# Patient Record
Sex: Male | Born: 1990 | Race: White | Hispanic: No | Marital: Single | Smoking: Current every day smoker
Health system: Southern US, Community
[De-identification: ages and names within clinical notes are randomized; demographics above are authoritative.]

## PROBLEM LIST (undated history)

## (undated) HISTORY — PX: APPENDECTOMY: SHX54

---

## 2013-12-09 ENCOUNTER — Emergency Department: Payer: Self-pay

## 2013-12-09 LAB — CBC WITH DIFFERENTIAL/PLATELET
Basophil #: 0.1 10*3/uL (ref 0.0–0.1)
Basophil %: 1.1 %
EOS ABS: 0.1 10*3/uL (ref 0.0–0.7)
Eosinophil %: 1.8 %
HCT: 47.7 % (ref 40.0–52.0)
HGB: 16.5 g/dL (ref 13.0–18.0)
Lymphocyte #: 1.5 10*3/uL (ref 1.0–3.6)
Lymphocyte %: 23.7 %
MCH: 31.3 pg (ref 26.0–34.0)
MCHC: 34.7 g/dL (ref 32.0–36.0)
MCV: 90 fL (ref 80–100)
MONOS PCT: 9.3 %
Monocyte #: 0.6 x10 3/mm (ref 0.2–1.0)
Neutrophil #: 4 10*3/uL (ref 1.4–6.5)
Neutrophil %: 64.1 %
Platelet: 215 10*3/uL (ref 150–440)
RBC: 5.28 10*6/uL (ref 4.40–5.90)
RDW: 12.2 % (ref 11.5–14.5)
WBC: 6.2 10*3/uL (ref 3.8–10.6)

## 2013-12-09 LAB — URINALYSIS, COMPLETE
BILIRUBIN, UR: NEGATIVE
Bacteria: NONE SEEN
Blood: NEGATIVE
Glucose,UR: NEGATIVE mg/dL (ref 0–75)
Ketone: NEGATIVE
Leukocyte Esterase: NEGATIVE
Nitrite: NEGATIVE
Ph: 6 (ref 4.5–8.0)
Protein: NEGATIVE
RBC,UR: 1 /HPF (ref 0–5)
Specific Gravity: 1.02 (ref 1.003–1.030)
Squamous Epithelial: NONE SEEN
WBC UR: 1 /HPF (ref 0–5)

## 2013-12-09 LAB — COMPREHENSIVE METABOLIC PANEL
ALBUMIN: 4.6 g/dL (ref 3.4–5.0)
ALK PHOS: 84 U/L
ALT: 19 U/L (ref 12–78)
ANION GAP: 2 — AB (ref 7–16)
BUN: 12 mg/dL (ref 7–18)
Bilirubin,Total: 0.6 mg/dL (ref 0.2–1.0)
CHLORIDE: 105 mmol/L (ref 98–107)
Calcium, Total: 9.2 mg/dL (ref 8.5–10.1)
Co2: 31 mmol/L (ref 21–32)
Creatinine: 0.92 mg/dL (ref 0.60–1.30)
GLUCOSE: 88 mg/dL (ref 65–99)
OSMOLALITY: 275 (ref 275–301)
POTASSIUM: 3.9 mmol/L (ref 3.5–5.1)
SGOT(AST): 29 U/L (ref 15–37)
Sodium: 138 mmol/L (ref 136–145)
Total Protein: 7.8 g/dL (ref 6.4–8.2)

## 2013-12-09 LAB — LIPASE, BLOOD: Lipase: 90 U/L (ref 73–393)

## 2014-05-05 ENCOUNTER — Emergency Department: Payer: Self-pay | Admitting: Student

## 2014-05-08 ENCOUNTER — Emergency Department: Payer: Self-pay | Admitting: Emergency Medicine

## 2017-08-19 ENCOUNTER — Emergency Department: Payer: No Typology Code available for payment source

## 2017-08-19 ENCOUNTER — Emergency Department
Admission: EM | Admit: 2017-08-19 | Discharge: 2017-08-19 | Disposition: A | Discharge status: Home / Self Care | Payer: No Typology Code available for payment source | Attending: Emergency Medicine | Admitting: Emergency Medicine

## 2017-08-19 ENCOUNTER — Encounter: Payer: Self-pay | Admitting: Emergency Medicine

## 2017-08-19 DIAGNOSIS — W010XXA Fall on same level from slipping, tripping and stumbling without subsequent striking against object, initial encounter: Secondary | ICD-10-CM | POA: Diagnosis not present

## 2017-08-19 DIAGNOSIS — Y939 Activity, unspecified: Secondary | ICD-10-CM | POA: Insufficient documentation

## 2017-08-19 DIAGNOSIS — F17200 Nicotine dependence, unspecified, uncomplicated: Secondary | ICD-10-CM | POA: Diagnosis not present

## 2017-08-19 DIAGNOSIS — Y990 Civilian activity done for income or pay: Secondary | ICD-10-CM | POA: Insufficient documentation

## 2017-08-19 DIAGNOSIS — Y929 Unspecified place or not applicable: Secondary | ICD-10-CM | POA: Insufficient documentation

## 2017-08-19 DIAGNOSIS — S3992XA Unspecified injury of lower back, initial encounter: Secondary | ICD-10-CM | POA: Diagnosis present

## 2017-08-19 DIAGNOSIS — W19XXXA Unspecified fall, initial encounter: Secondary | ICD-10-CM

## 2017-08-19 DIAGNOSIS — S300XXA Contusion of lower back and pelvis, initial encounter: Secondary | ICD-10-CM | POA: Diagnosis not present

## 2017-08-19 NOTE — ED Provider Notes (Signed)
[facility] Emergency Department Provider Note ____________________________________________  Time seen: 1858  I have reviewed the triage vital signs and the nursing notes.  HISTORY  Chief Complaint  Fall   HPI Austin Armstrong is a 27 y.o. male presents himself to the ED for evaluation of injury sustained following a mechanical fall.  Patient describes being at work yesterday evening, when he accidentally slipped on a wet floor.  He describes falling after he lost his footing, landing on his back.  He denies any head injury, loss of consciousness, laceration, or abrasion.  He presents today at the urging of his employer for further evaluation and management.  He describes pain and tightness to the left lower back that is worse with prolonged sitting.  He denies any distal paresthesias, footdrop, or incontinence.  Also denies any previous history of chronic ongoing back pain.  Patient rates his pain at a 6 out of 10 during the exam.  History reviewed. No pertinent past medical history.  There are no active problems to display for this patient.   Past Surgical History:  Procedure Laterality Date  . APPENDECTOMY      Prior to Admission medications   Not on File    Allergies Zyrtec [cetirizine]  No family history on file.  Social History Social History   Tobacco Use  . Smoking status: Current Every Day Smoker  . Smokeless tobacco: Never Used  Substance Use Topics  . Alcohol use: Yes  . Drug use: No    Review of Systems  Constitutional: Negative for fever. Cardiovascular: Negative for chest pain. Respiratory: Negative for shortness of breath. Gastrointestinal: Negative for abdominal pain, vomiting and diarrhea. Genitourinary: Negative for dysuria. Musculoskeletal: Positive for back pain. Skin: Negative for rash. Neurological: Negative for headaches, focal weakness or numbness. ____________________________________________  PHYSICAL  EXAM:  VITAL SIGNS: ED Triage Vitals  Enc Vitals Group     BP 08/19/17 1804 (!) 147/85     Pulse Rate 08/19/17 1804 63     Resp 08/19/17 1804 20     Temp 08/19/17 1804 97.9 F (36.6 C)     Temp Source 08/19/17 1804 Oral     SpO2 08/19/17 1804 99 %     Weight 08/19/17 1804 205 lb (93 kg)     Height 08/19/17 1804 5\' 10"  (1.778 m)     Head Circumference --      Peak Flow --      Pain Score 08/19/17 1809 6     Pain Loc --      Pain Edu? --      Excl. in GC? --     Constitutional: Alert and oriented. Well appearing and in no distress. Head: Normocephalic and atraumatic. Cardiovascular: Normal rate, regular rhythm. Normal distal pulses. Respiratory: Normal respiratory effort. No wheezes/rales/rhonchi. Musculoskeletal: No spinal alignment without midline tenderness, spasm, deformity, or step-off.  No obvious bruising, contusion, or ecchymosis noted to the lower back.  Patient is mildly tender to palpation over the left lumbar sacral paraspinal musculature.  He is able to transition from sit to stand without assistance.  Normal lumbar flexion and extension range on exam.  Nontender with normal range of motion in all extremities.  Neurologic: Cranial nerves II through XII grossly intact.  Negative Trendelenburg.  Negative seated straight leg raise.  Normal LE DTRs bilaterally.  Normal foot eversion and toe dorsiflexion.  Normal gait without ataxia. Normal speech and language. No gross focal neurologic deficits are appreciated. Skin:  Skin is  warm, dry and intact. No rash noted. Psychiatric: Mood and affect are normal. Patient exhibits appropriate insight and judgment. ____________________________________________   RADIOLOGY  Lumbar Spine  Negative ____________________________________________  INITIAL IMPRESSION / ASSESSMENT AND PLAN / ED COURSE  Patient with ED evaluation of injury sustained following a chemical fall yesterday.  Patient describes a contusion to the lower back after he  slipped on wet floor.  His exam is overall benign at this time.  No indication of any acute fracture, dislocation, or neuromuscular deficit.  Patient is also reassured by his negative x-rays.  He reports that he intends to return to work tomorrow as scheduled.  He will follow with his company's medical provider for ongoing symptoms.  He will dose over-the-counter ibuprofen as needed. ____________________________________________  FINAL CLINICAL IMPRESSION(S) / ED DIAGNOSES  Final diagnoses:  Fall, initial encounter  Contusion of lower back, initial encounter      Lissa HoardMenshew, Anu Stagner V Bacon, PA-C 08/19/17 2047    Minna AntisPaduchowski, Kevin, MD 08/19/17 2340

## 2017-08-19 NOTE — Discharge Instructions (Signed)
Your exam and x-ray are negative at this time. You will likely experience some sore muscles over the next few days. Take OTC ibuprofen for pain and inflammation. Apply ice or moist heat to reduce any tightness. Follow-up with [facility] for continued symptoms.

## 2017-08-19 NOTE — ED Triage Notes (Signed)
Pt here for workers comp after falling at work. Pt had mechanical fall. Pt denies hitting head or losing consciousness. Pt complains of lower back pain. Pt is able to move all four extremities independently.

## 2018-08-09 ENCOUNTER — Encounter: Payer: Self-pay | Admitting: Emergency Medicine

## 2018-08-09 DIAGNOSIS — A0811 Acute gastroenteropathy due to Norwalk agent: Secondary | ICD-10-CM | POA: Insufficient documentation

## 2018-08-09 DIAGNOSIS — F17200 Nicotine dependence, unspecified, uncomplicated: Secondary | ICD-10-CM | POA: Insufficient documentation

## 2018-08-09 LAB — COMPREHENSIVE METABOLIC PANEL
ALT: 51 U/L — ABNORMAL HIGH (ref 0–44)
AST: 39 U/L (ref 15–41)
Albumin: 4.6 g/dL (ref 3.5–5.0)
Alkaline Phosphatase: 95 U/L (ref 38–126)
Anion gap: 7 (ref 5–15)
BUN: 15 mg/dL (ref 6–20)
CO2: 27 mmol/L (ref 22–32)
Calcium: 8.8 mg/dL — ABNORMAL LOW (ref 8.9–10.3)
Chloride: 105 mmol/L (ref 98–111)
Creatinine, Ser: 0.71 mg/dL (ref 0.61–1.24)
GFR calc non Af Amer: >60 mL/min (ref >60)
Glucose, Bld: 104 mg/dL — ABNORMAL HIGH (ref 70–99)
Potassium: 3.8 mmol/L (ref 3.5–5.1)
Sodium: 139 mmol/L (ref 135–145)
Total Bilirubin: 1 mg/dL (ref 0.3–1.2)
Total Protein: 7.3 g/dL (ref 6.5–8.1)

## 2018-08-09 LAB — URINALYSIS, COMPLETE (UACMP) WITH MICROSCOPIC
BILIRUBIN URINE: NEGATIVE
Bacteria, UA: NONE SEEN
Glucose, UA: NEGATIVE mg/dL
Hgb urine dipstick: NEGATIVE
Ketones, ur: NEGATIVE mg/dL
Leukocytes, UA: NEGATIVE
NITRITE: NEGATIVE
Protein, ur: NEGATIVE mg/dL
SPECIFIC GRAVITY, URINE: 1.026 (ref 1.005–1.030)
pH: 5 (ref 5.0–8.0)

## 2018-08-09 LAB — CBC
HCT: 48.1 % (ref 39.0–52.0)
Hemoglobin: 17 g/dL (ref 13.0–17.0)
MCH: 31.3 pg (ref 26.0–34.0)
MCHC: 35.3 g/dL (ref 30.0–36.0)
MCV: 88.4 fL (ref 80.0–100.0)
NRBC: 0 % (ref 0.0–0.2)
Platelets: 261 10*3/uL (ref 150–400)
RBC: 5.44 MIL/uL (ref 4.22–5.81)
RDW: 11.5 % (ref 11.5–15.5)
WBC: 12.7 10*3/uL — ABNORMAL HIGH (ref 4.0–10.5)

## 2018-08-09 LAB — TROPONIN I: Troponin I: <0.03 ng/mL (ref <0.03)

## 2018-08-09 LAB — LIPASE, BLOOD: LIPASE: 25 U/L (ref 11–51)

## 2018-08-09 MED ORDER — SODIUM CHLORIDE 0.9% FLUSH: 3.0000 mL | Freq: Once | INTRAVENOUS | Status: DC

## 2018-08-09 NOTE — ED Triage Notes (Signed)
Pt c/o upper epigastric abdominal pain since Sunday as well as N/V. Pt denies diarrhea.

## 2018-08-10 ENCOUNTER — Emergency Department
Admission: EM | Admit: 2018-08-10 | Discharge: 2018-08-10 | Disposition: A | Discharge status: Home / Self Care | Payer: Self-pay | Attending: Emergency Medicine | Admitting: Emergency Medicine

## 2018-08-10 DIAGNOSIS — A0811 Acute gastroenteropathy due to Norwalk agent: Secondary | ICD-10-CM

## 2018-08-10 LAB — GASTROINTESTINAL PANEL BY PCR, STOOL (REPLACES STOOL CULTURE)

## 2018-08-10 LAB — C DIFFICILE QUICK SCREEN W PCR REFLEX
C Diff antigen: NEGATIVE
C Diff interpretation: NOT DETECTED
C Diff toxin: NEGATIVE

## 2018-08-10 MED ORDER — ONDANSETRON HCL 4 MG/2ML IJ SOLN
4.0000 mg | Freq: Once | INTRAMUSCULAR | Status: AC
  Administered 2018-08-10: 4 mg via INTRAVENOUS
  Filled 2018-08-10: qty 2

## 2018-08-10 MED ORDER — LOPERAMIDE HCL 2 MG PO CAPS
4.0000 mg | ORAL_CAPSULE | Freq: Once | ORAL | Status: AC
  Administered 2018-08-10: 4 mg via ORAL
  Filled 2018-08-10: qty 2

## 2018-08-10 MED ORDER — SODIUM CHLORIDE 0.9 % IV BOLUS
1000.0000 mL | Freq: Once | INTRAVENOUS | Status: AC
  Administered 2018-08-10: 1000 mL via INTRAVENOUS

## 2018-08-10 MED ORDER — ONDANSETRON 4 MG PO TBDP: 4.0000 mg | ORAL_TABLET | Freq: Three times a day (TID) | ORAL | 0 refills | Status: AC | PRN

## 2018-08-10 NOTE — ED Provider Notes (Addendum)
[facility] Emergency Department Provider Note  ____________________________________________   First MD Initiated Contact with Patient 08/10/18 [phone]     (approximate)  I have reviewed the triage vital signs and the nursing notes.   HISTORY  Chief Complaint Abdominal Pain   HPI Austin Armstrong is a 28 y.o. male presents to the emergency department with abdominal cramping nausea vomiting and diarrhea which began on Sunday.  Patient denies any fever afebrile on presentation.  Patient states that he has had multiple episodes of nonbloody emesis and diarrhea.  Patient denies any known sick contact.   No pertinent past medical history   There are no active problems to display for this patient.   Past Surgical History:  Procedure Laterality Date  . APPENDECTOMY      Prior to Admission medications   Medication Sig Start Date End Date Taking? Authorizing Provider  ondansetron (ZOFRAN ODT) 4 MG disintegrating tablet Take 1 tablet (4 mg total) by mouth every 8 (eight) hours as needed for nausea or vomiting. 08/10/18   Darci CurrentBrown, Laurel N, MD    Allergies Zyrtec [cetirizine]  History reviewed. No pertinent family history.  Social History Social History   Tobacco Use  . Smoking status: Current Every Day Smoker  . Smokeless tobacco: Never Used  Substance Use Topics  . Alcohol use: Yes  . Drug use: No    Review of Systems Constitutional: No fever/chills Eyes: No visual changes. ENT: No sore throat. Cardiovascular: Denies chest pain. Respiratory: Denies shortness of breath. Gastrointestinal: Positive for abdominal cramping nausea vomiting diarrhea Genitourinary: Negative for dysuria. Musculoskeletal: Negative for neck pain.  Negative for back pain. Integumentary: Negative for rash. Neurological: Negative for headaches, focal weakness or numbness.  ____________________________________________   PHYSICAL EXAM:  VITAL SIGNS: ED Triage  Vitals [08/09/18 2234]  Enc Vitals Group     BP 138/84     Pulse Rate (!) 105     Resp 20     Temp 98 F (36.7 C)     Temp Source Oral     SpO2 96 %     Weight 95.3 kg (210 lb)     Height 1.753 m (5\' 9" )     Head Circumference      Peak Flow      Pain Score      Pain Loc      Pain Edu?      Excl. in GC?     Constitutional: Alert and oriented. Well appearing and in no acute distress. Eyes: Conjunctivae are normal. Mouth/Throat: Mucous membranes are dry.  Oropharynx non-erythematous. Neck: No stridor.   Cardiovascular: Normal rate, regular rhythm. Good peripheral circulation. Grossly normal heart sounds. Respiratory: Normal respiratory effort.  No retractions. Lungs CTAB. Gastrointestinal: Soft and nontender. No distention.  Musculoskeletal: No lower extremity tenderness nor edema. No gross deformities of extremities. Neurologic:  Normal speech and language. No gross focal neurologic deficits are appreciated.  Skin:  Skin is warm, dry and intact. No rash noted.   ____________________________________________   LABS (all labs ordered are listed, but only abnormal results are displayed)  Labs Reviewed  GASTROINTESTINAL PANEL BY PCR, STOOL (REPLACES STOOL CULTURE) - Abnormal; Notable for the following components:      Result Value   Norovirus GI/GII DETECTED (*)    All other components within normal limits  COMPREHENSIVE METABOLIC PANEL - Abnormal; Notable for the following components:   Glucose, Bld 104 (*)    Calcium 8.8 (*)    ALT  51 (*)    All other components within normal limits  CBC - Abnormal; Notable for the following components:   WBC 12.7 (*)    All other components within normal limits  URINALYSIS, COMPLETE (UACMP) WITH MICROSCOPIC - Abnormal; Notable for the following components:   Color, Urine YELLOW (*)    APPearance CLEAR (*)    All other components within normal limits  C DIFFICILE QUICK SCREEN W PCR REFLEX  LIPASE, BLOOD  TROPONIN I    ED ECG  REPORT I, [place] N Yerania Chamorro, the attending physician, personally viewed and interpreted this ECG.   Date: 08/09/2018  EKG Time: 10:43 PM  Rate: 103  Rhythm: Sinus tachycardia  Axis: Normal  Intervals: Normal  ST&T Change: None   Procedures   ____________________________________________   INITIAL IMPRESSION / ASSESSMENT AND PLAN / ED COURSE  As part of my medical decision making, I reviewed the following data within the electronic MEDICAL RECORD NUMBER  28 year old male presenting with above-stated history and physical exam concerning for possible infectious diarrhea.  Stool sample was obtained which is positive for norovirus.  Patient was given 2 L IV normal saline IV Zofran 4 mg and Imodium 4 mg no further vomiting or diarrhea while in the emergency department. ____________________________________________  FINAL CLINICAL IMPRESSION(S) / ED DIAGNOSES  Final diagnoses:  Enteritis due to Norovirus     MEDICATIONS GIVEN DURING THIS VISIT:  Medications  sodium chloride flush (NS) 0.9 % injection 3 mL (0 mLs Intravenous Hold 08/10/18 0101)  sodium chloride 0.9 % bolus 1,000 mL (0 mLs Intravenous Stopped 08/10/18 0353)  sodium chloride 0.9 % bolus 1,000 mL (0 mLs Intravenous Stopped 08/10/18 0353)  ondansetron (ZOFRAN) injection 4 mg (4 mg Intravenous Given 08/10/18 0136)  loperamide (IMODIUM) capsule 4 mg (4 mg Oral Given 08/10/18 0129)     ED Discharge Orders         Ordered    ondansetron (ZOFRAN ODT) 4 MG disintegrating tablet  Every 8 hours PRN     08/10/18 0340           Note:  This document was prepared using Dragon voice recognition software and may include unintentional dictation errors.    Darci Current, MD 08/10/18 8280    Darci Current, MD 09/02/18 [phone]

## 2018-08-10 NOTE — ED Notes (Signed)
Pt states he has had abdominal pain for past 2 weeks and it got worse on yesterday. Pain centrally located. He also has nausea, vomiting and diarrhea. Family at bedside.

## 2019-02-02 IMAGING — CR DG LUMBAR SPINE COMPLETE 4+V
1 series · 5 of 5 positions shown · non-contrast
Comparison: None.

CLINICAL DATA: Low back pain.  Fall.

EXAM:
LUMBAR SPINE - COMPLETE 4+ VIEW

[Series 1: dg lumbar spine complete 4 +v · 0.14mm/px · 5 of 5 slices shown]
[im 1/5]
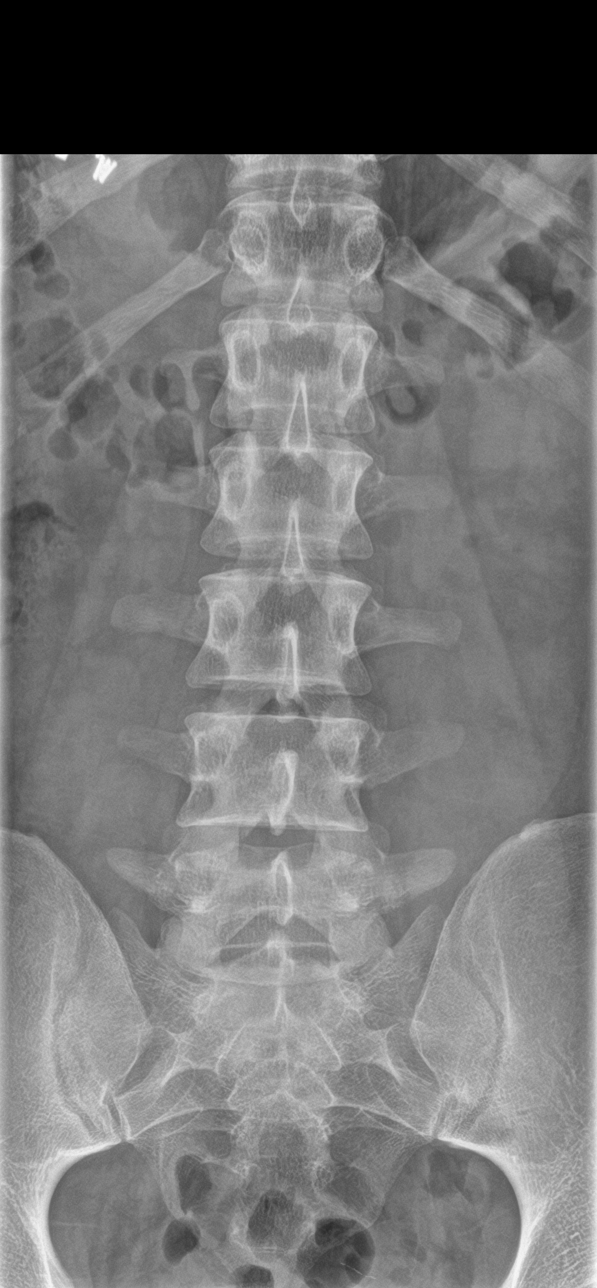
[im 2/5]
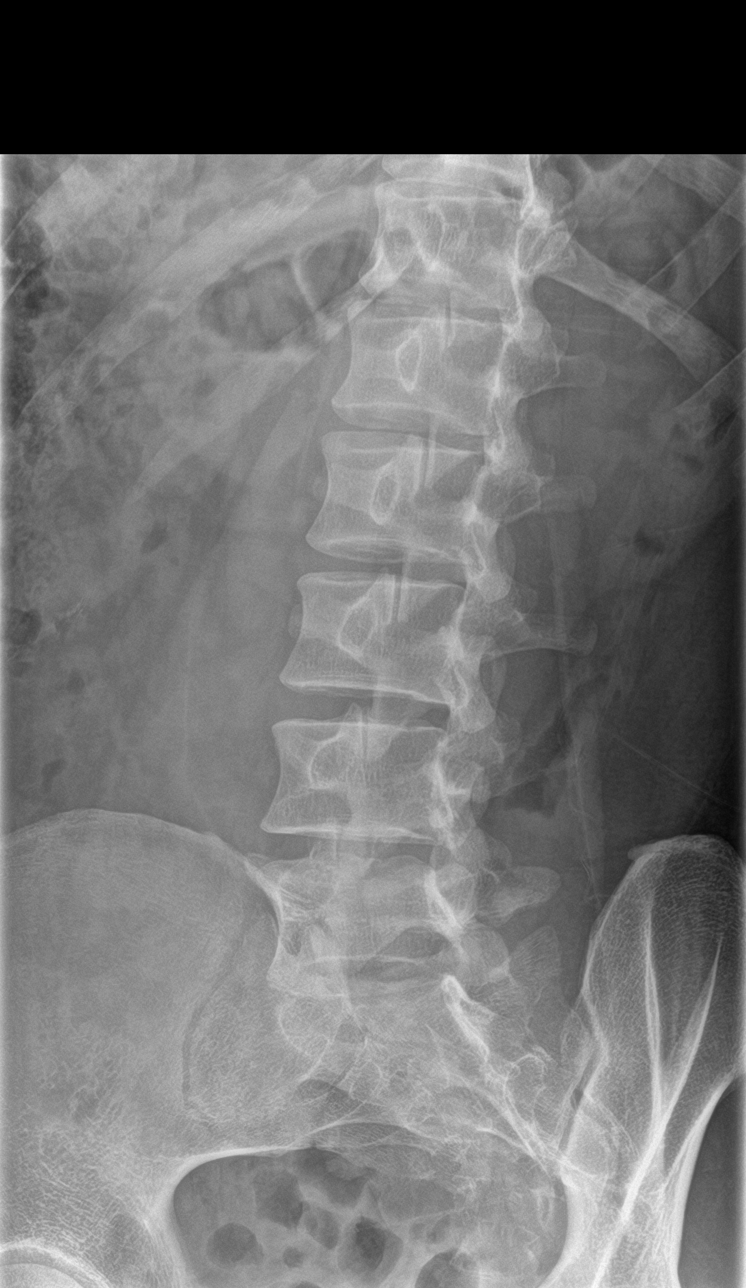
[im 3/5]
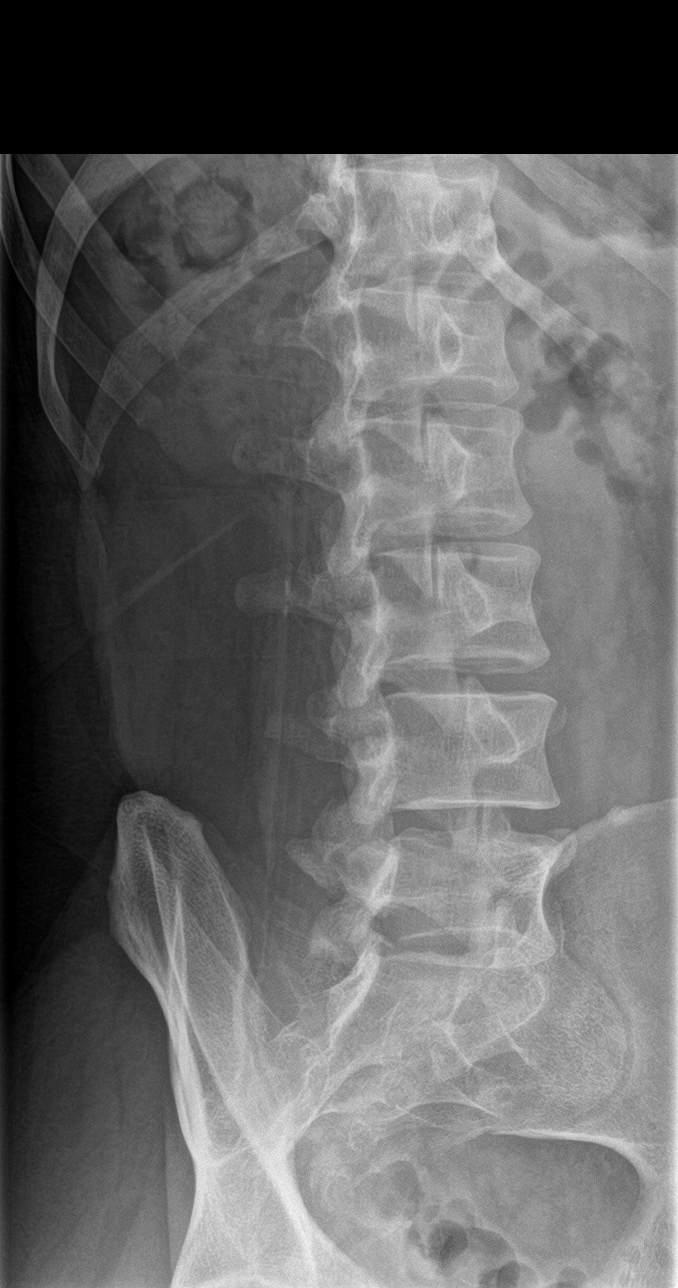
[im 4/5]
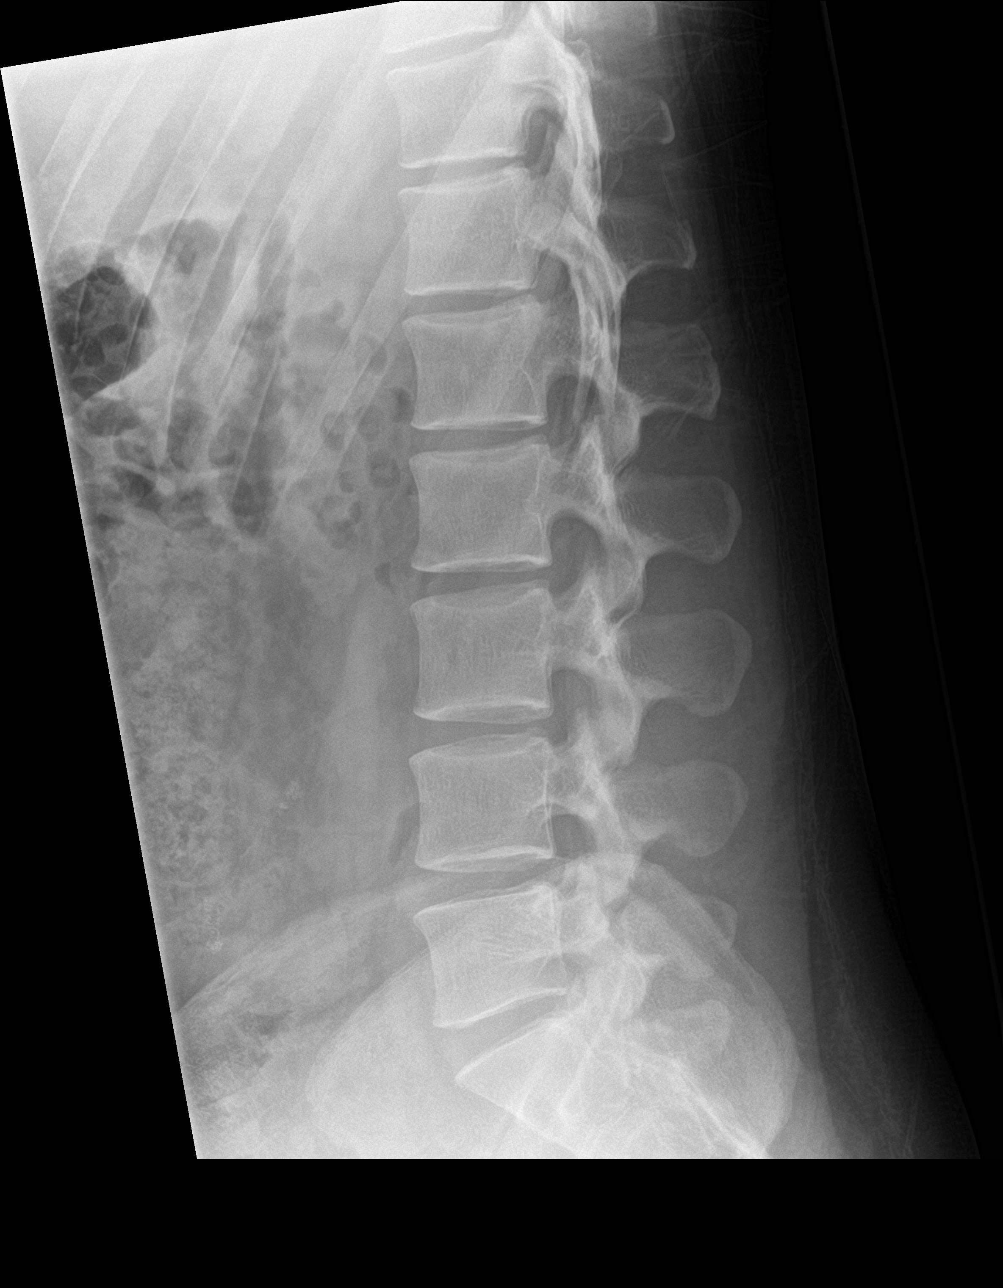
[im 5/5]
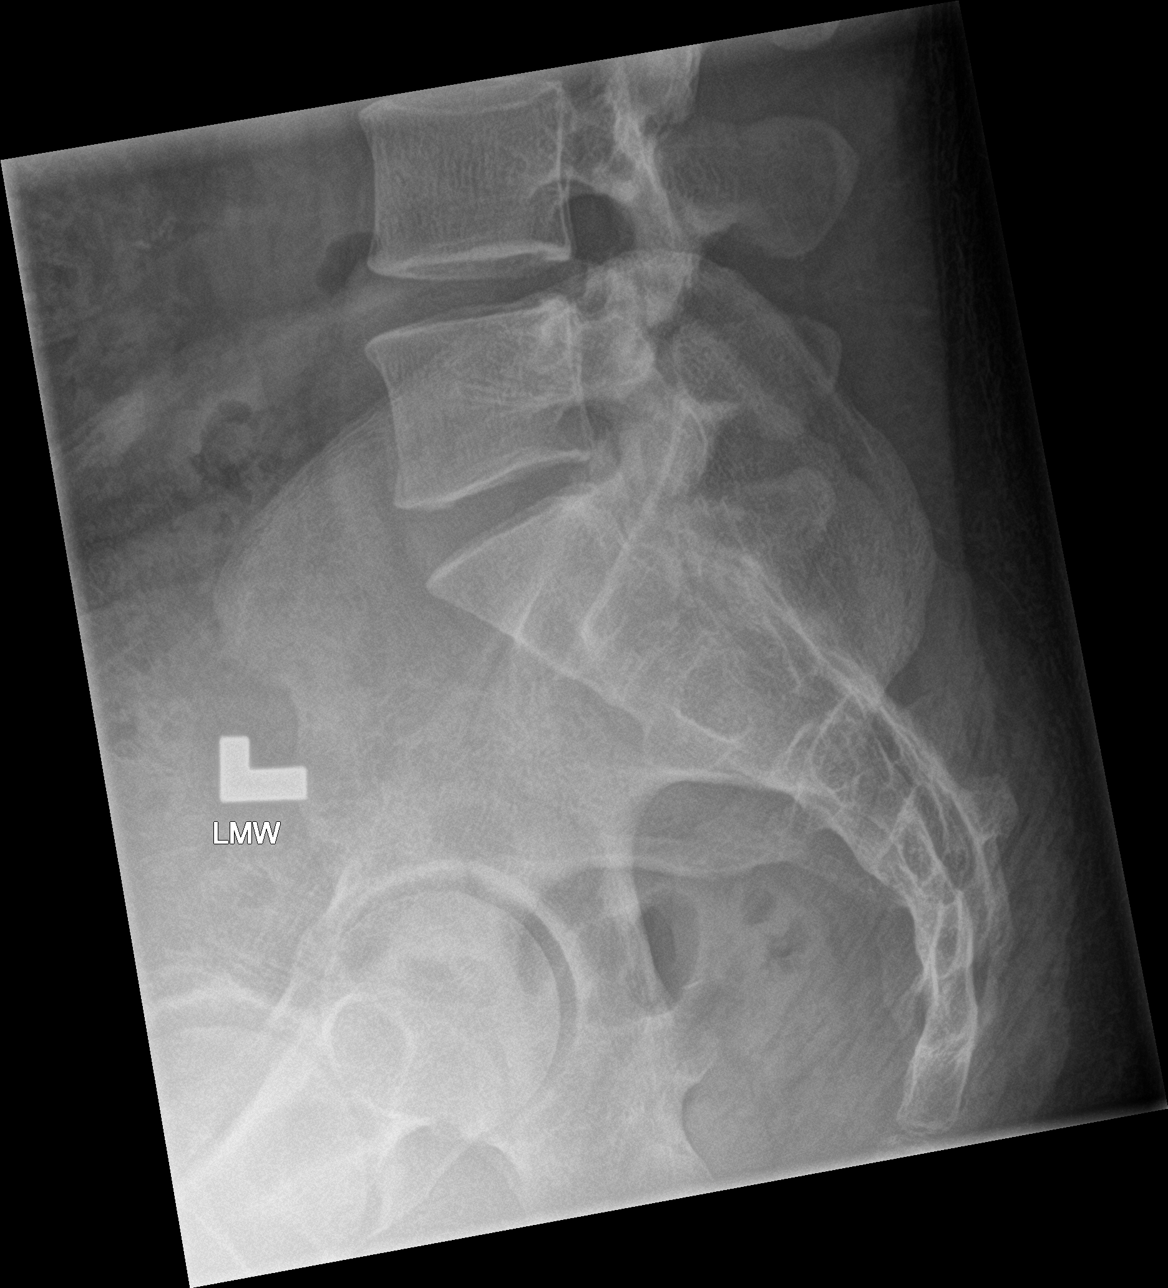

[5 of 5 positions shown; findings below may reference images not displayed]

FINDINGS: There is no evidence of lumbar spine fracture. Alignment is normal.
Intervertebral disc spaces are maintained.
IMPRESSION: Negative.

## 2019-11-18 ENCOUNTER — Ambulatory Visit: Payer: HRSA Program | Attending: Internal Medicine

## 2019-11-18 DIAGNOSIS — Z20822 Contact with and (suspected) exposure to covid-19: Secondary | ICD-10-CM | POA: Insufficient documentation

## 2019-11-19 LAB — NOVEL CORONAVIRUS, NAA: SARS-CoV-2, NAA: NOT DETECTED

## 2019-11-19 LAB — SARS-COV-2, NAA 2 DAY TAT

## 2020-02-28 ENCOUNTER — Encounter: Payer: Self-pay | Admitting: Emergency Medicine

## 2020-02-28 ENCOUNTER — Other Ambulatory Visit: Payer: Self-pay

## 2020-02-28 DIAGNOSIS — W260XXA Contact with knife, initial encounter: Secondary | ICD-10-CM | POA: Insufficient documentation

## 2020-02-28 DIAGNOSIS — S61011A Laceration without foreign body of right thumb without damage to nail, initial encounter: Secondary | ICD-10-CM | POA: Insufficient documentation

## 2020-02-28 DIAGNOSIS — Z23 Encounter for immunization: Secondary | ICD-10-CM | POA: Insufficient documentation

## 2020-02-28 DIAGNOSIS — Y93G1 Activity, food preparation and clean up: Secondary | ICD-10-CM | POA: Insufficient documentation

## 2020-02-28 DIAGNOSIS — Y999 Unspecified external cause status: Secondary | ICD-10-CM | POA: Insufficient documentation

## 2020-02-28 DIAGNOSIS — Y92010 Kitchen of single-family (private) house as the place of occurrence of the external cause: Secondary | ICD-10-CM | POA: Insufficient documentation

## 2020-02-28 DIAGNOSIS — F17210 Nicotine dependence, cigarettes, uncomplicated: Secondary | ICD-10-CM | POA: Insufficient documentation

## 2020-02-28 NOTE — ED Notes (Signed)
Pt reports bleeding through bandage, reinforced at this time, continues to bleed, advised pt to elevate and apply pressure.

## 2020-02-28 NOTE — ED Triage Notes (Addendum)
Patient ambulatory to triage with steady gait, without difficulty or distress noted; pt reports PTA cut left thumb with knife while washing dishesl approx 1.5" lac noted with mod amount bleeding; gauze pressure dressing applied

## 2020-02-29 ENCOUNTER — Emergency Department
Admission: EM | Admit: 2020-02-29 | Discharge: 2020-02-29 | Disposition: A | Discharge status: Home / Self Care | Payer: Self-pay | Attending: Emergency Medicine | Admitting: Emergency Medicine

## 2020-02-29 DIAGNOSIS — S61011A Laceration without foreign body of right thumb without damage to nail, initial encounter: Secondary | ICD-10-CM

## 2020-02-29 MED ORDER — CEPHALEXIN 500 MG PO CAPS: 500.0000 mg | ORAL_CAPSULE | Freq: Two times a day (BID) | ORAL | 0 refills | Status: AC

## 2020-02-29 MED ORDER — TETANUS-DIPHTH-ACELL PERTUSSIS 5-2.5-18.5 LF-MCG/0.5 IM SUSP
0.5000 mL | Freq: Once | INTRAMUSCULAR | Status: AC
  Administered 2020-02-29: 0.5 mL via INTRAMUSCULAR
  Filled 2020-02-29: qty 0.5

## 2020-02-29 MED ORDER — CEPHALEXIN 500 MG PO CAPS
500.0000 mg | ORAL_CAPSULE | Freq: Once | ORAL | Status: AC
  Administered 2020-02-29: 500 mg via ORAL
  Filled 2020-02-29: qty 1

## 2020-02-29 MED ORDER — LIDOCAINE HCL (PF) 1 % IJ SOLN
5.0000 mL | Freq: Once | INTRAMUSCULAR | Status: AC
  Administered 2020-02-29: 5 mL via INTRADERMAL

## 2020-02-29 NOTE — Discharge Instructions (Signed)
You have been seen in the Emergency Department (ED) today for a laceration (cut).  Please keep the cut clean but do not submerge it in the water.  It has been repaired with staples or sutures that will need to be removed in about 7 days. Please follow up with your doctor, an urgent care, or return to the ED for suture removal.   ° °Please take Tylenol (acetaminophen) or Motrin (ibuprofen) as needed for discomfort as written on the box.  ° °Please follow up with your doctor as soon as possible regarding today's emergent visit.  ° °Return to the ED or call your doctor if you notice any signs of infection such as fever, increased pain, increased redness, pus, or other symptoms that concern you. °

## 2020-02-29 NOTE — ED Provider Notes (Signed)
[facility] Emergency Department Provider Note  ____________________________________________   First MD Initiated Contact with Patient 02/29/20 [phone]     (approximate)  I have reviewed the triage vital signs and the nursing notes.   HISTORY  Chief Complaint Laceration    HPI Austin Armstrong is a 29 y.o. male  Who presents for evaluation of laceration on top of his right thumb (he is right-hand dominant).  He was getting ready to do dishes and had a sink full of water with some dishes in it and he reached hand and cut his finger along a knife.  The laceration goes across the top of the joint and has been bleeding steadily.  He has some numbness in the finger.  He is able to flex it but does not want to do so because it hurts to do so.  No other injuries.  He does not remember the date of his last tetanus vaccination but it was likely "when I was a kid".  He said that he rinsed the laceration as soon as it happened.       History reviewed. No pertinent past medical history.  There are no problems to display for this patient.   Past Surgical History:  Procedure Laterality Date  . APPENDECTOMY      Prior to Admission medications   Medication Sig Start Date End Date Taking? Authorizing Provider  cephALEXin (KEFLEX) 500 MG capsule Take 1 capsule (500 mg total) by mouth 2 (two) times daily for 3 days. 02/29/20 03/03/20  Loleta Rose, MD  ondansetron (ZOFRAN ODT) 4 MG disintegrating tablet Take 1 tablet (4 mg total) by mouth every 8 (eight) hours as needed for nausea or vomiting. 08/10/18   Darci Current, MD    Allergies Zyrtec [cetirizine]  No family history on file.  Social History Social History   Tobacco Use  . Smoking status: Current Every Day Smoker  . Smokeless tobacco: Never Used  Vaping Use  . Vaping Use: Never used  Substance Use Topics  . Alcohol use: Yes  . Drug use: No    Review of Systems Constitutional: No  fever/chills Respiratory: Denies shortness of breath. Gastrointestinal: No abdominal pain.   Musculoskeletal: Laceration to right thumb. Integumentary: Laceration to the top of the right thumb Neurological: There is some numbness in his right thumb.   ____________________________________________   PHYSICAL EXAM:  VITAL SIGNS: ED Triage Vitals [02/28/20 2216]  Enc Vitals Group     BP (!) 123/97     Pulse Rate 84     Resp 18     Temp 98.2 F (36.8 C)     Temp src      SpO2 98 %     Weight 94.9 kg (209 lb 3.5 oz)     Height 1.753 m (5\' 9" )     Head Circumference      Peak Flow      Pain Score 6     Pain Loc      Pain Edu?      Excl. in GC?     Constitutional: Alert and oriented.  Eyes: Conjunctivae are normal.  Head: Atraumatic. Cardiovascular: Normal rate, regular rhythm. Good peripheral circulation.  Respiratory: Normal respiratory effort.  No retractions. Musculoskeletal: Laceration to the top of right thumb.  See skin exam for additional details. Neurologic:  Normal speech and language. No gross focal neurologic deficits are appreciated.  Skin: There is a 2.4-cm curvilinear laceration along the dorsal aspect of  the interphalangeal joint of the right thumb.  There is steady bleeding from the wound in spite of direct pressure.  No foreign bodies are noted. Psychiatric: Mood and affect are normal. Speech and behavior are normal.  ____________________________________________   LABS (all labs ordered are listed, but only abnormal results are displayed)  Labs Reviewed - No data to display ____________________________________________  EKG  No dictation for EKG ____________________________________________  RADIOLOGY I, Loleta Rose, personally viewed and evaluated these images (plain radiographs) as part of my medical decision making, as well as reviewing the written report by the radiologist.  ED MD interpretation: No indication for emergent imaging  Official  radiology report(s): No results found.  ____________________________________________   PROCEDURES   Procedure(s) performed (including Critical Care):  Marland KitchenMarland KitchenLaceration Repair  Date/Time: 02/29/2020 5:30 AM Performed by: Loleta Rose, MD Authorized by: Loleta Rose, MD   Consent:    Consent obtained:  Verbal   Consent given by:  Patient   Risks discussed:  Infection, pain, retained foreign body, poor cosmetic result, poor wound healing and nerve damage Anesthesia (see MAR for exact dosages):    Anesthesia method:  Local infiltration   Local anesthetic:  Lidocaine 1% w/o epi Laceration details:    Location:  Finger   Finger location:  R thumb   Length (cm):  2.4 Repair type:    Repair type:  Simple Pre-procedure details:    Preparation:  Patient was prepped and draped in usual sterile fashion Exploration:    Hemostasis achieved with:  Direct pressure   Wound exploration: entire depth of wound probed and visualized     Contaminated: no   Treatment:    Area cleansed with:  Saline   Amount of cleaning:  Extensive   Irrigation solution:  Sterile saline   Visualized foreign bodies/material removed: no   Skin repair:    Repair method:  Sutures   Suture size:  5-0   Wound skin closure material used: Ethilon.   Suture technique:  Simple interrupted   Number of sutures:  3 Approximation:    Approximation:  Close Post-procedure details:    Dressing:  Sterile dressing and splint for protection   Patient tolerance of procedure:  Tolerated well, no immediate complications     ____________________________________________   INITIAL IMPRESSION / MDM / ASSESSMENT AND PLAN / ED COURSE  As part of my medical decision making, I reviewed the following data within the electronic MEDICAL RECORD NUMBER Nursing notes reviewed and incorporated, Old chart reviewed and Notes from prior ED visits   Relatively uncomplicated laceration to the top of his right thumb.  Bleeding well controlled  after sutures.  Thumb splint for protection, Tdap, 3 days of Keflex given that the injury occurred more than 8 hours ago and was briefly submerged in [employer].  One dose of Keflex in the ED.  I had my usual customary laceration care discussion with the patient including follow-up recommendations and he understands and agrees with the plan.          ____________________________________________  FINAL CLINICAL IMPRESSION(S) / ED DIAGNOSES  Final diagnoses:  Laceration of right thumb without foreign body without damage to nail, initial encounter     MEDICATIONS GIVEN DURING THIS VISIT:  Medications  cephALEXin (KEFLEX) capsule 500 mg (has no administration in time range)  Tdap (BOOSTRIX) injection 0.5 mL (has no administration in time range)  lidocaine (PF) (XYLOCAINE) 1 % injection 5 mL (5 mLs Intradermal Given by Other 02/29/20 4010)  ED Discharge Orders         Ordered    cephALEXin (KEFLEX) 500 MG capsule  2 times daily        02/29/20 8469          *Please note:  Austin Armstrong was evaluated in Emergency Department on 02/29/2020 for the symptoms described in the history of present illness. He was evaluated in the context of the global COVID-19 pandemic, which necessitated consideration that the patient might be at risk for infection with the SARS-CoV-2 virus that causes COVID-19. Institutional protocols and algorithms that pertain to the evaluation of patients at risk for COVID-19 are in a state of rapid change based on information released by regulatory bodies including the CDC and federal and state organizations. These policies and algorithms were followed during the patient's care in the ED.  Some ED evaluations and interventions may be delayed as a result of limited staffing during and after the pandemic.*  Note:  This document was prepared using Dragon voice recognition software and may include unintentional dictation errors.   Loleta Rose, MD 02/29/20  [phone]

## 2020-03-07 ENCOUNTER — Emergency Department
Admission: EM | Admit: 2020-03-07 | Discharge: 2020-03-07 | Disposition: A | Discharge status: Home / Self Care | Payer: Self-pay | Attending: Emergency Medicine | Admitting: Emergency Medicine

## 2020-03-07 ENCOUNTER — Encounter: Payer: Self-pay | Admitting: Emergency Medicine

## 2020-03-07 ENCOUNTER — Other Ambulatory Visit: Payer: Self-pay

## 2020-03-07 DIAGNOSIS — Z4802 Encounter for removal of sutures: Secondary | ICD-10-CM | POA: Insufficient documentation

## 2020-03-07 DIAGNOSIS — Z5321 Procedure and treatment not carried out due to patient leaving prior to being seen by health care provider: Secondary | ICD-10-CM | POA: Insufficient documentation

## 2020-03-07 DIAGNOSIS — F17210 Nicotine dependence, cigarettes, uncomplicated: Secondary | ICD-10-CM | POA: Insufficient documentation

## 2020-03-07 NOTE — ED Triage Notes (Signed)
Patient presents to the ED to have sutures removed from his right hand that were placed last Wednesday.  Patient states he has not had any issues with sutures or wound.

## 2020-03-07 NOTE — ED Triage Notes (Signed)
Patient presents for stitch removal.  Patient had stitches placed last Wednesday.  Patient has dressing to his right hand.

## 2020-03-07 NOTE — ED Notes (Addendum)
DISREGARD PT VITALS @ 1758, THESE WERE PUT IN PT CHART BY ERROR

## 2020-03-07 NOTE — ED Provider Notes (Signed)
[facility] Emergency Department Provider Note  ____________________________________________   First MD Initiated Contact with Patient 03/07/20 1802     (approximate)  I have reviewed the triage vital signs and the nursing notes.   HISTORY  Chief Complaint Suture / Staple Removal    HPI Austin Armstrong is a 29 y.o. male presents emergency department for suture removal.  Patient had a thumb laceration and stitches have been in place for 7 days.  No fever or chills.  No drainage from site.    History reviewed. No pertinent past medical history.  There are no problems to display for this patient.   Past Surgical History:  Procedure Laterality Date  . APPENDECTOMY      Prior to Admission medications   Medication Sig Start Date End Date Taking? Authorizing Provider  ondansetron (ZOFRAN ODT) 4 MG disintegrating tablet Take 1 tablet (4 mg total) by mouth every 8 (eight) hours as needed for nausea or vomiting. 08/10/18   Darci Current, MD    Allergies Zyrtec [cetirizine]  No family history on file.  Social History Social History   Tobacco Use  . Smoking status: Current Every Day Smoker  . Smokeless tobacco: Never Used  Vaping Use  . Vaping Use: Never used  Substance Use Topics  . Alcohol use: Yes  . Drug use: No    Review of Systems  Constitutional: No fever/chills Eyes: No visual changes. ENT: No sore throat. Respiratory: Denies cough Genitourinary: Negative for dysuria. Musculoskeletal: Negative for back pain. Skin: Negative for rash. Psychiatric: no mood changes,     ____________________________________________   PHYSICAL EXAM:  VITAL SIGNS: ED Triage Vitals  Enc Vitals Group     BP 03/07/20 1758 (!) 157/73     Pulse --      Resp 03/07/20 1758 18     Temp 03/07/20 1758 98.5 F (36.9 C)     Temp src --      SpO2 03/07/20 1758 100 %     Weight 03/07/20 1741 215 lb (97.5 kg)     Height 03/07/20 1741 5\' 9"   (1.753 m)     Head Circumference --      Peak Flow --      Pain Score 03/07/20 1741 2     Pain Loc --      Pain Edu? --      Excl. in GC? --     Constitutional: Alert and oriented. Well appearing and in no acute distress. Eyes: Conjunctivae are normal.  Head: Atraumatic. Nose: No congestion/rhinnorhea. Mouth/Throat: Mucous membranes are moist.  Neck:  supple no lymphadenopathy noted Cardiovascular: Normal rate, regular rhythm.  Respiratory: Normal respiratory effort.  No retractions,  GU: deferred Musculoskeletal: FROM all extremities, warm and well perfused, sutures are intact and the wound appears to be well approximated at the right thumb Neurologic:  Normal speech and language.  Skin:  Skin is warm, dry and intact. No rash noted. Psychiatric: Mood and affect are normal. Speech and behavior are normal.  ____________________________________________   LABS (all labs ordered are listed, but only abnormal results are displayed)  Labs Reviewed - No data to display ____________________________________________   ____________________________________________  RADIOLOGY    ____________________________________________   PROCEDURES  Procedure(s) performed: No  Procedures    ____________________________________________   INITIAL IMPRESSION / ASSESSMENT AND PLAN / ED COURSE  Pertinent labs & imaging results that were available during my care of the patient were reviewed by me and considered in  my medical decision making (see chart for details).   Patient is 29 year old male presents for suture removal.  Sutures are intact and the wound appears well approximated.  Patient states he has been holding his thumb very straight because he was afraid he would rip out the sutures.  He is able to bend it but it does create some discomfort.  Sutures were removed.  Steri-Strips applied assessed directly on the joint.  He is to follow-up with orthopedics if still complaining of pain  at the thumb or decreased range of motion.  States she understands.  Is discharged stable condition.     Austin Armstrong was evaluated in Emergency Department on 03/07/2020 for the symptoms described in the history of present illness. He was evaluated in the context of the global COVID-19 pandemic, which necessitated consideration that the patient might be at risk for infection with the SARS-CoV-2 virus that causes COVID-19. Institutional protocols and algorithms that pertain to the evaluation of patients at risk for COVID-19 are in a state of rapid change based on information released by regulatory bodies including the CDC and federal and state organizations. These policies and algorithms were followed during the patient's care in the ED.    As part of my medical decision making, I reviewed the following data within the electronic MEDICAL RECORD NUMBER Nursing notes reviewed and incorporated, Old chart reviewed, Notes from prior ED visits and [place] Controlled Substance Database  ____________________________________________   FINAL CLINICAL IMPRESSION(S) / ED DIAGNOSES  Final diagnoses:  Visit for suture removal      NEW MEDICATIONS STARTED DURING THIS VISIT:  Discharge Medication List as of 03/07/2020  6:04 PM       Note:  This document was prepared using Dragon voice recognition software and may include unintentional dictation errors.    Faythe Ghee, PA-C 03/07/20 1948    Phineas Semen, MD 03/07/20 2114

## 2021-10-05 ENCOUNTER — Other Ambulatory Visit: Payer: Self-pay

## 2021-10-05 ENCOUNTER — Encounter: Payer: Self-pay | Admitting: Emergency Medicine

## 2021-10-05 ENCOUNTER — Emergency Department
Admission: EM | Admit: 2021-10-05 | Discharge: 2021-10-05 | Disposition: A | Discharge status: Home / Self Care | Payer: BLUE CROSS/BLUE SHIELD | Attending: Emergency Medicine | Admitting: Emergency Medicine

## 2021-10-05 DIAGNOSIS — L0231 Cutaneous abscess of buttock: Secondary | ICD-10-CM | POA: Insufficient documentation

## 2021-10-05 DIAGNOSIS — L0291 Cutaneous abscess, unspecified: Secondary | ICD-10-CM

## 2021-10-05 MED ORDER — KETOROLAC TROMETHAMINE 60 MG/2ML IM SOLN
30.0000 mg | Freq: Once | INTRAMUSCULAR | Status: AC
  Administered 2021-10-05: 30 mg via INTRAMUSCULAR
  Filled 2021-10-05: qty 2

## 2021-10-05 MED ORDER — SULFAMETHOXAZOLE-TRIMETHOPRIM 800-160 MG PO TABS
1.0000 | ORAL_TABLET | Freq: Once | ORAL | Status: AC
  Administered 2021-10-05: 1 via ORAL
  Filled 2021-10-05: qty 1

## 2021-10-05 MED ORDER — LIDOCAINE HCL (PF) 1 % IJ SOLN
5.0000 mL | Freq: Once | INTRAMUSCULAR | Status: AC
  Administered 2021-10-05: 5 mL
  Filled 2021-10-05: qty 5

## 2021-10-05 MED ORDER — SULFAMETHOXAZOLE-TRIMETHOPRIM 800-160 MG PO TABS: 1.0000 | ORAL_TABLET | Freq: Two times a day (BID) | ORAL | 0 refills | Status: AC

## 2021-10-05 NOTE — ED Notes (Signed)
Pt to ED via POV, c/o poss spider bite to R buttocks. Pt with large area of redness and mild swelling, states has been ongoing x 2 days. Pt states has grown in size, is now hard to touch, c/o pain with sitting/movement/palpation.  ?

## 2021-10-05 NOTE — ED Provider Notes (Signed)
? ?  [facility] ?Provider Note ? ? ? Event Date/Time  ? First MD Initiated Contact with Patient 10/05/21 2208   ?  (approximate) ? ? ?History  ? ?Insect Bite ? ? ?HPI ? ?Austin Armstrong is a 31 y.o. male who presents to the emergency department today because of concerns for possible insect bite to his left buttock.  He states that this occurred a couple of nights ago.  Initially had a small area of redness and discomfort there but today noticed it is gotten a lot bigger.  It has been painful.  He denies any fevers. ? ? ?Physical Exam  ? ?Triage Vital Signs: ?ED Triage Vitals  ?Enc Vitals Group  ?   BP 10/05/21 2156 (!) 142/96  ?   Pulse Rate 10/05/21 2156 (!) 102  ?   Resp --   ?   Temp 10/05/21 2156 98.3 ?F (36.8 ?C)  ?   Temp Source 10/05/21 2156 Oral  ?   SpO2 10/05/21 2156 95 %  ?   Weight 10/05/21 2202 210 lb (95.3 kg)  ?   Height 10/05/21 2202 5\' 8"  (1.727 m)  ?   Head Circumference --   ?   Peak Flow --   ?   Pain Score 10/05/21 2201 8  ? ?Most recent vital signs: ?Vitals:  ? 10/05/21 2156  ?BP: (!) 142/96  ?Pulse: (!) 102  ?Temp: 98.3 ?F (36.8 ?C)  ?SpO2: 95%  ? ? ?General: Awake, no distress.  ?CV:  Good peripheral perfusion.  ?Resp:  Normal effort.  ?Abd:  No distention.  ?Skin:  Erythema, swelling fluctuant area to left buttock. ? ? ?ED Results / Procedures / Treatments  ? ?Labs ?(all labs ordered are listed, but only abnormal results are displayed) ?Labs Reviewed - No data to display ? ? ?EKG ? ?None ? ? ?RADIOLOGY ?None ? ?PROCEDURES: ? ?Critical Care performed: No ? ?Procedures ? ?Incision and Drainage of Abscess ?Location: left buttock ?Anesthesia ?Local: 1% Lidocaine with Epi ? ?Prep/Procedure: ?Skin Prep: Chlorahex ?Incised abscess with #11 blade ?Purulent discharge: small ?Probed to break up loculations ?Packed with 1/4" gauze ?Estimated blood loss: 2 ml ? ? ?MEDICATIONS ORDERED IN ED: ?Medications - No data to display ? ? ?IMPRESSION / MDM / ASSESSMENT AND PLAN / ED  COURSE  ?I reviewed the triage vital signs and the nursing notes. ?             ?               ? ?Differential diagnosis includes, but is not limited to, abscess, cellulitis.  ? ?Patient presented to the emergency department today because of concern for area of swelling and redness to the left buttock. Exam is consistent with abscess. I and D was performed. Will plan on giving patient first dose of antibiotics here and prescription for antibiotics.  ? ? ? ?FINAL CLINICAL IMPRESSION(S) / ED DIAGNOSES  ? ?Final diagnoses:  ?Abscess  ? ? ? ? ?Note:  This document was prepared using Dragon voice recognition software and may include unintentional dictation errors. ? ?  ?2157, MD ?10/05/21 2322 ? ?

## 2021-10-05 NOTE — Discharge Instructions (Signed)
Please seek medical attention for any high fevers, chest pain, shortness of breath, change in behavior, persistent vomiting, bloody stool or any other new or concerning symptoms.  

## 2021-10-11 ENCOUNTER — Emergency Department
Admission: EM | Admit: 2021-10-11 | Discharge: 2021-10-11 | Disposition: A | Discharge status: Home / Self Care | Payer: BLUE CROSS/BLUE SHIELD | Attending: Emergency Medicine | Admitting: Emergency Medicine

## 2021-10-11 ENCOUNTER — Encounter: Payer: Self-pay | Admitting: Emergency Medicine

## 2021-10-11 DIAGNOSIS — L0231 Cutaneous abscess of buttock: Secondary | ICD-10-CM | POA: Insufficient documentation

## 2021-10-11 DIAGNOSIS — Z5189 Encounter for other specified aftercare: Secondary | ICD-10-CM

## 2021-10-11 DIAGNOSIS — B9562 Methicillin resistant Staphylococcus aureus infection as the cause of diseases classified elsewhere: Secondary | ICD-10-CM | POA: Diagnosis not present

## 2021-10-11 DIAGNOSIS — A4902 Methicillin resistant Staphylococcus aureus infection, unspecified site: Secondary | ICD-10-CM

## 2021-10-11 MED ORDER — MUPIROCIN CALCIUM 2 % EX CREA: 1.0000 "application " | TOPICAL_CREAM | Freq: Two times a day (BID) | CUTANEOUS | 0 refills | Status: AC

## 2021-10-11 NOTE — ED Provider Notes (Signed)
? ?[facility] ?Provider Note ? ?Patient Contact: 9:12 PM (approximate) ? ? ?History  ? ?Abscess ? ? ?HPI ? ?Austin Armstrong is a 31 y.o. male who presents the emergency department for reevaluation of an abscess to his buttock.  Patient presented to the ED roughly a week ago, had an abscess drained.  He was prescribed antibiotics but states that there was an issue with the pharmacy getting his antibiotics.  Has been putting topical antibiotic on until he was able to pick up his prescription yesterday and has been on antibiotics.  Patient states that he believes that he has either a new area of abscess adjacent to the previous 1 or he is he has some skin from the original incision and drainage that may need trimming. ?  ? ? ?Physical Exam  ? ?Triage Vital Signs: ?ED Triage Vitals  ?Enc Vitals Group  ?   BP 10/11/21 2031 (!) 117/95  ?   Pulse Rate 10/11/21 2031 (!) 104  ?   Resp 10/11/21 2031 20  ?   Temp 10/11/21 2031 98.7 ?F (37.1 ?C)  ?   Temp Source 10/11/21 2031 Oral  ?   SpO2 10/11/21 2031 95 %  ?   Weight 10/11/21 2032 210 lb 5.1 oz (95.4 kg)  ?   Height 10/11/21 2032 5\' 8"  (1.727 m)  ?   Head Circumference --   ?   Peak Flow --   ?   Pain Score 10/11/21 2031 6  ?   Pain Loc --   ?   Pain Edu? --   ?   Excl. in GC? --   ? ? ?Most recent vital signs: ?Vitals:  ? 10/11/21 2031  ?BP: (!) 117/95  ?Pulse: (!) 104  ?Resp: 20  ?Temp: 98.7 ?F (37.1 ?C)  ?SpO2: 95%  ? ? ? ?General: Alert and in no acute distress. ?Cardiovascular:  Good peripheral perfusion ?Respiratory: Normal respiratory effort without tachypnea or retractions. Lungs CTAB.  ?Musculoskeletal: Full range of motion to all extremities.  ?Neurologic:  No gross focal neurologic deficits are appreciated.  ?Skin:   No rash noted.  Visualization of the left buttock reveals a previously incised and drained lesion to the left buttock.  Patient had a small strip of dead skin from the incision and drainage that he was mentioning.  This  was trimmed in the ED.  Area appears to be slightly smaller than his previous encounter.  Palpation does not elicit any purulence.  There is no fluctuance.  Area is still firm to palpation however. ?Other: ? ? ?ED Results / Procedures / Treatments  ? ?Labs ?(all labs ordered are listed, but only abnormal results are displayed) ?Labs Reviewed - No data to display ? ? ?EKG ? ? ? ? ?RADIOLOGY ? ? ? ?No results found. ? ?PROCEDURES: ? ?Critical Care performed: No ? ?Procedures ? ? ?MEDICATIONS ORDERED IN ED: ?Medications - No data to display ? ? ?IMPRESSION / MDM / ASSESSMENT AND PLAN / ED COURSE  ?I reviewed the triage vital signs and the nursing notes. ?             ?               ? ?Differential diagnosis includes, but is not limited to, worsening abscess, increasing cellulitis, ? ? ?Patient's diagnosis is consistent with wound check previous abscess.  Patient presents emergency department for evaluation of an abscess to the left buttock.  It already been incised  and drained there was an issue with the pharmacy getting his antibiotics.  He was able to start antibiotic yesterday.  Area does appear to be improved from previous description.  Incision area is still open.  There is a strip of dead tissue along the edge of the incision that I did trim here in the emergency department.  However there does not appear to be any new abscess.  No indication for extension of incision.  Patient mentioned incidentally as I was leaving that he had a lesion inside his right nare.  Evaluation this appears to have a small area of infection along the lateral nares.  I will prescribe topical Bactroban as he likely has MRSA given his abscess to the buttock and this finding in his nose.  Continue his oral antibiotic at home.  Return precautions discussed with the patient..  Follow-up primary care as needed.  Patient is given ED precautions to return to the ED for any worsening or new symptoms. ? ? ? ?  ? ? ?FINAL CLINICAL IMPRESSION(S) /  ED DIAGNOSES  ? ?Final diagnoses:  ?Wound check, abscess  ?MRSA (methicillin resistant Staphylococcus aureus)  ? ? ? ?Rx / DC Orders  ? ?ED Discharge Orders   ? ?      Ordered  ?  mupirocin cream (BACTROBAN) 2 %  2 times daily       ? 10/11/21 2130  ? ?  ?  ? ?  ? ? ? ?Note:  This document was prepared using Dragon voice recognition software and may include unintentional dictation errors. ?  ?Racheal Patches, PA-C ?10/11/21 2130 ? ?  ?Dionne Bucy, MD ?10/11/21 2258 ? ?

## 2021-10-11 NOTE — ED Notes (Signed)
Did not want discharge vitals. 

## 2021-10-11 NOTE — ED Triage Notes (Signed)
Pt presents via POV for a recurring abscess on his left buttock. He states that he was seen here last week for the last week and he has not been able to begin antibiotics due to a pharmacy issue. He notes that he has had mild drainage.  ?
